# Patient Record
Sex: Female | Born: 1960
Health system: Southern US, Community
[De-identification: ages and names within clinical notes are randomized; demographics above are authoritative.]

## PROBLEM LIST (undated history)

## (undated) DIAGNOSIS — F329 Major depressive disorder, single episode, unspecified: Secondary | ICD-10-CM

## (undated) DIAGNOSIS — F32A Depression, unspecified: Secondary | ICD-10-CM

## (undated) DIAGNOSIS — E079 Disorder of thyroid, unspecified: Secondary | ICD-10-CM

## (undated) DIAGNOSIS — H9319 Tinnitus, unspecified ear: Secondary | ICD-10-CM

## (undated) HISTORY — PX: TONSILLECTOMY: SUR1361

## (undated) HISTORY — PX: ABDOMINAL HYSTERECTOMY: SHX81

---

## 2004-11-26 ENCOUNTER — Ambulatory Visit (HOSPITAL_COMMUNITY): Admission: RE | Admit: 2004-11-26 | Discharge: 2004-11-26 | Payer: Self-pay | Admitting: Orthopedic Surgery

## 2010-05-24 ENCOUNTER — Encounter: Payer: Self-pay | Admitting: Orthopedic Surgery

## 2016-04-28 ENCOUNTER — Encounter (HOSPITAL_BASED_OUTPATIENT_CLINIC_OR_DEPARTMENT_OTHER): Payer: Self-pay

## 2016-04-28 ENCOUNTER — Emergency Department (HOSPITAL_BASED_OUTPATIENT_CLINIC_OR_DEPARTMENT_OTHER)
Admission: EM | Admit: 2016-04-28 | Discharge: 2016-04-28 | Disposition: A | Discharge status: Home / Self Care | Payer: BLUE CROSS/BLUE SHIELD | Attending: Emergency Medicine | Admitting: Emergency Medicine

## 2016-04-28 ENCOUNTER — Emergency Department (HOSPITAL_BASED_OUTPATIENT_CLINIC_OR_DEPARTMENT_OTHER): Payer: BLUE CROSS/BLUE SHIELD

## 2016-04-28 DIAGNOSIS — Z79899 Other long term (current) drug therapy: Secondary | ICD-10-CM | POA: Diagnosis not present

## 2016-04-28 DIAGNOSIS — H8110 Benign paroxysmal vertigo, unspecified ear: Secondary | ICD-10-CM | POA: Diagnosis not present

## 2016-04-28 DIAGNOSIS — R42 Dizziness and giddiness: Secondary | ICD-10-CM | POA: Diagnosis present

## 2016-04-28 HISTORY — DX: Depression, unspecified: F32.A

## 2016-04-28 HISTORY — DX: Tinnitus, unspecified ear: H93.19

## 2016-04-28 HISTORY — DX: Major depressive disorder, single episode, unspecified: F32.9

## 2016-04-28 HISTORY — DX: Disorder of thyroid, unspecified: E07.9

## 2016-04-28 LAB — BASIC METABOLIC PANEL
Anion gap: 8 (ref 5–15)
BUN: 16 mg/dL (ref 6–20)
CHLORIDE: 103 mmol/L (ref 101–111)
CO2: 25 mmol/L (ref 22–32)
CREATININE: 0.6 mg/dL (ref 0.44–1.00)
Calcium: 9 mg/dL (ref 8.9–10.3)
GFR calc Af Amer: >60 mL/min (ref >60)
GFR calc non Af Amer: >60 mL/min (ref >60)
GLUCOSE: 99 mg/dL (ref 65–99)
Potassium: 3.6 mmol/L (ref 3.5–5.1)
SODIUM: 136 mmol/L (ref 135–145)

## 2016-04-28 LAB — CBC
HCT: 40.7 % (ref 36.0–46.0)
HEMOGLOBIN: 12.9 g/dL (ref 12.0–15.0)
MCH: 28.2 pg (ref 26.0–34.0)
MCHC: 31.7 g/dL (ref 30.0–36.0)
MCV: 89.1 fL (ref 78.0–100.0)
PLATELETS: 356 10*3/uL (ref 150–400)
RBC: 4.57 MIL/uL (ref 3.87–5.11)
RDW: 14.2 % (ref 11.5–15.5)
WBC: 8.1 10*3/uL (ref 4.0–10.5)

## 2016-04-28 MED ORDER — MECLIZINE HCL 25 MG PO TABS
25.0000 mg | ORAL_TABLET | Freq: Once | ORAL | Status: AC
  Administered 2016-04-28: 25 mg via ORAL
  Filled 2016-04-28: qty 1

## 2016-04-28 MED ORDER — SODIUM CHLORIDE 0.9 % IV BOLUS (SEPSIS)
1000.0000 mL | Freq: Once | INTRAVENOUS | Status: AC
  Administered 2016-04-28: 1000 mL via INTRAVENOUS

## 2016-04-28 MED ORDER — ONDANSETRON 4 MG PO TBDP: 4.0000 mg | ORAL_TABLET | Freq: Three times a day (TID) | ORAL | 0 refills | Status: AC | PRN

## 2016-04-28 MED ORDER — MECLIZINE HCL 25 MG PO TABS: 25.0000 mg | ORAL_TABLET | Freq: Three times a day (TID) | ORAL | 0 refills | Status: AC | PRN

## 2016-04-28 MED FILL — ONDANSETRON ODT 4 MG TABLET: 4 | 7 days supply | Qty: 20 | Fill #0

## 2016-04-28 MED FILL — MECLIZINE 25 MG TABLET: 25 | 10 days supply | Qty: 30 | Fill #0

## 2016-04-28 NOTE — ED Triage Notes (Signed)
C/o dizziness, n/v, pain to head and neck x 4 days-unsure if pain is r/t to holding head in same position to ease dizziness-denies injury-NAD-steady gait

## 2016-04-28 NOTE — ED Notes (Signed)
Patient transported to CT 

## 2016-04-28 NOTE — ED Notes (Signed)
Patient given sprite for PO challenge.  

## 2016-04-28 NOTE — ED Provider Notes (Signed)
[facility]   CSN: [id number] Arrival date & time: 04/28/16  1209     History   Chief Complaint Chief Complaint  Patient presents with  . Dizziness    HPI Donna Melton is a 55 y.o. female.  HPI  Pt presenting with c/o dizziness- she states that over the past week she has been very dizzy with room spinning when she turns her head to the side or bends over.  This has caused associated vomiting.  She also c/o pain in right side of neck that has developed due to sleeping in recliner trying to hold head very straight.  She also states she has had tinnitus in her ears for over one year.  She has not been evaluated for this in the past.  No fever.  No changes in vision or speech.  No focal weakness.  There are no other associated systemic symptoms, there are no other alleviating or modifying factors.   Past Medical History:  Diagnosis Date  . Depression   . Thyroid disease   . Tinnitus     There are no active problems to display for this patient.   Past Surgical History:  Procedure Laterality Date  . ABDOMINAL HYSTERECTOMY    . TONSILLECTOMY      OB History    No data available       Home Medications    Prior to Admission medications   Medication Sig Start Date End Date Taking? Authorizing Provider  BuPROPion HCl (WELLBUTRIN PO) Take by mouth.   Yes Historical Provider, MD  Levothyroxine Sodium (SYNTHROID PO) Take by mouth.   Yes Historical Provider, MD  meclizine (ANTIVERT) 25 MG tablet Take 1 tablet (25 mg total) by mouth 3 (three) times daily as needed for dizziness. 04/28/16   Jerelyn ScottMartha Linker, MD  ondansetron (ZOFRAN ODT) 4 MG disintegrating tablet Take 1 tablet (4 mg total) by mouth every 8 (eight) hours as needed for nausea or vomiting. 04/28/16   Jerelyn ScottMartha Linker, MD    Family History No family history on file.  Social History Social History  Substance Use Topics  . Smoking status: Never Smoker  . Smokeless tobacco: Never Used  .  Alcohol use No     Allergies   Patient has no known allergies.   Review of Systems Review of Systems  ROS reviewed and all otherwise negative except for mentioned in HPI   Physical Exam Updated Vital Signs BP 139/93 (BP Location: Right Arm)   Pulse 79   Temp 98.1 F (36.7 C) (Oral)   Resp 18   Ht 5\' 4"  (1.626 m)   Wt (!) 361 lb (163.7 kg)   SpO2 96%   BMI 61.97 kg/m  Vitals reviewed Physical Exam Physical Examination: General appearance - alert, well appearing, and in no distress Mental status - alert, oriented to person, place, and time Eyes - pupils equal and reactive, extraocular eye movements intact, some nystagmus with eye movements and this brings on her symptoms Mouth - mucous membranes moist, pharynx normal without lesions Neck - supple, no significant adenopathy, ttp over right paraspinal cervical tenderness Chest - clear to auscultation, no wheezes, rales or rhonchi, symmetric air entry Heart - normal rate, regular rhythm, normal S1, S2, no murmurs, rubs, clicks or gallops Abdomen - soft, nontender, nondistended, no masses or organomegaly Neurological - alert, oriented x 3, no cranial nerve defect, strength 5/5 in extremities x 4, sensation intact, nystagmus occurs and symptoms are reproduced with movement of head.  Extremities - peripheral pulses normal, no pedal edema, no clubbing or cyanosis Skin - normal coloration and turgor, no rashes  ED Treatments / Results  Labs (all labs ordered are listed, but only abnormal results are displayed) Labs Reviewed  CBC  BASIC METABOLIC PANEL    EKG  EKG Interpretation  Date/Time:  Wednesday April 28 2016 12:33:38 EST Ventricular Rate:  73 PR Interval:  136 QRS Duration: 96 QT Interval:  398 QTC Calculation: 438 R Axis:   4 Text Interpretation:  Normal sinus rhythm Cannot rule out Anterior infarct , age undetermined Abnormal ECG No old tracing to compare Confirmed by [facility]  MD, Sybel Standish [phone]([id number]) on 04/28/2016  12:48:51 PM       Radiology Ct Head Wo Contrast  Result Date: 04/28/2016 CLINICAL DATA:  Vertigo. EXAM: CT HEAD WITHOUT CONTRAST TECHNIQUE: Contiguous axial images were obtained from the base of the skull through the vertex without intravenous contrast. COMPARISON:  None. FINDINGS: Brain: No evidence of acute infarction, hemorrhage, hydrocephalus, extra-axial collection or mass lesion/mass effect. Vascular: Atherosclerosis of carotid siphons is noted. Skull: Normal. Negative for fracture or focal lesion. Sinuses/Orbits: No acute finding. Other: None. IMPRESSION: No acute intracranial abnormality seen. Electronically Signed   By: Lupita RaiderJames  Green Jr, M.D.   On: 04/28/2016 15:15    Procedures Procedures (including critical care time)  Medications Ordered in ED Medications  meclizine (ANTIVERT) tablet 25 mg (25 mg Oral Given 04/28/16 1459)  sodium chloride 0.9 % bolus 1,000 mL (0 mLs Intravenous Stopped 04/28/16 1621)     Initial Impression / Assessment and Plan / ED Course  I have reviewed the triage vital signs and the nursing notes.  Pertinent labs & imaging results that were available during my care of the patient were reviewed by me and considered in my medical decision making (see chart for details).  Clinical Course     Pt presenting with c/o vertigo- this is very related to position in this patient, reproducible on exam with turning head side to side.  Otherwise nonfocal neurologic exam.  Due to hx of tinnitus- head CT obtained to screen for acoustic neuroma or other tumor- this was reassuring.  Pt feeling improved after meclizine and zofran. She has received a fluid bolus and is able to tolerate po fluids in the ED.  Advised v/u with PMD and neurology. Discharged with strict return precautions.  Pt agreeable with plan.  Final Clinical Impressions(s) / ED Diagnoses   Final diagnoses:  Benign paroxysmal positional vertigo, unspecified laterality    New Prescriptions New  Prescriptions   MECLIZINE (ANTIVERT) 25 MG TABLET    Take 1 tablet (25 mg total) by mouth 3 (three) times daily as needed for dizziness.   ONDANSETRON (ZOFRAN ODT) 4 MG DISINTEGRATING TABLET    Take 1 tablet (4 mg total) by mouth every 8 (eight) hours as needed for nausea or vomiting.     Jerelyn ScottMartha Linker, MD 04/28/16 [id number]

## 2016-04-28 NOTE — Discharge Instructions (Signed)
Return to the ED with any concerns including changes in vision or speech, weakness of arms or legs, fainting, vomiting and not able to keep down liquids, decreased level of alertness/lethargy, or any other alarming symptoms °

## 2016-04-28 NOTE — ED Notes (Signed)
Patient drinking slowly on sprite without difficulties. Patient reports she still feels dizzy when she doesn't look straight ahead.

## 2017-11-03 IMAGING — CT CT HEAD W/O CM
3 series · 15 of 47 positions shown, 18 images · non-contrast
Comparison: None.

CLINICAL DATA: Vertigo.

EXAM:
CT HEAD WITHOUT CONTRAST
TECHNIQUE: Contiguous axial images were obtained from the base of the skull
through the vertex without intravenous contrast.

[Series 2: head wo · axial · 0.42mm/px · z∈[-172,-37]mm · 9 of 33 slices shown, 12 images]
[im 3/33  brain]
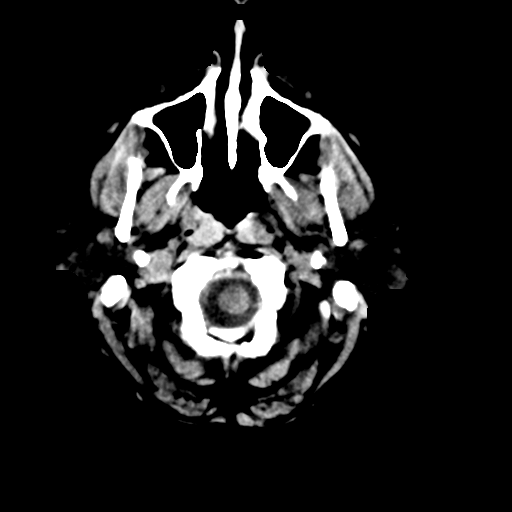
[im 3/33  bone]
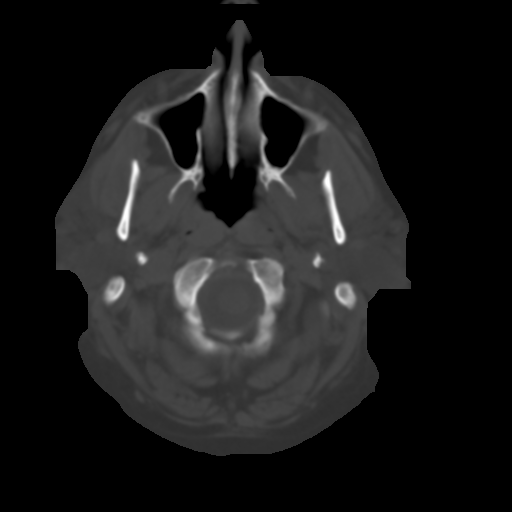
[im 6/33  brain]
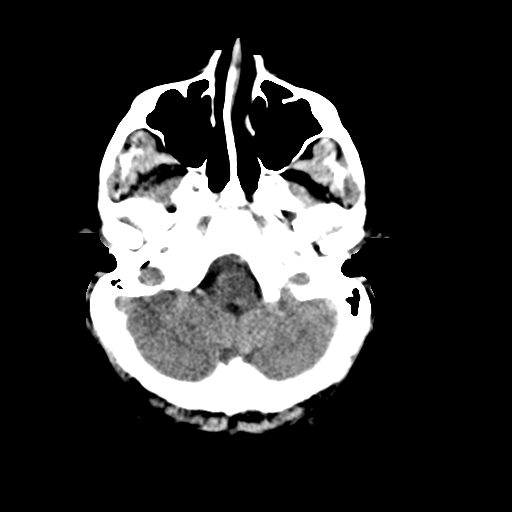
[im 9/33  brain]
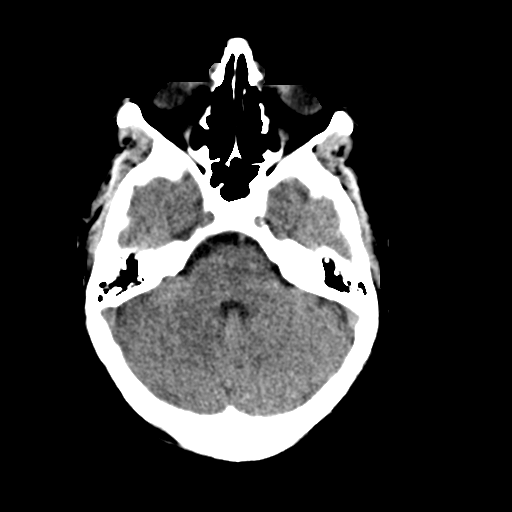
[im 13/33  brain]
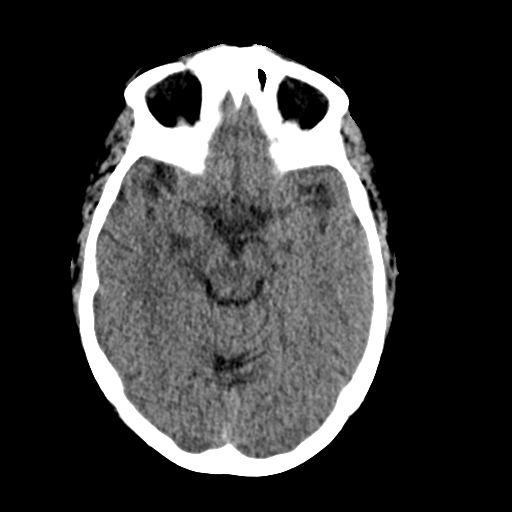
[im 17/33  brain]
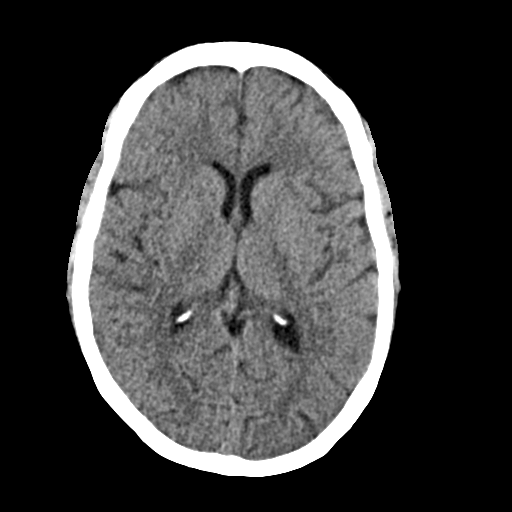
[im 17/33  bone]
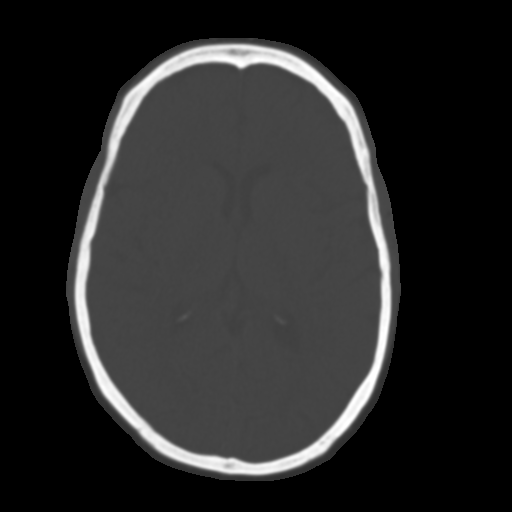
[im 20/33  brain]
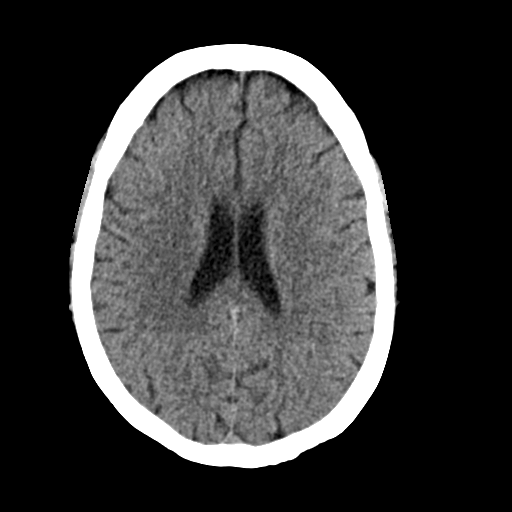
[im 24/33  brain]
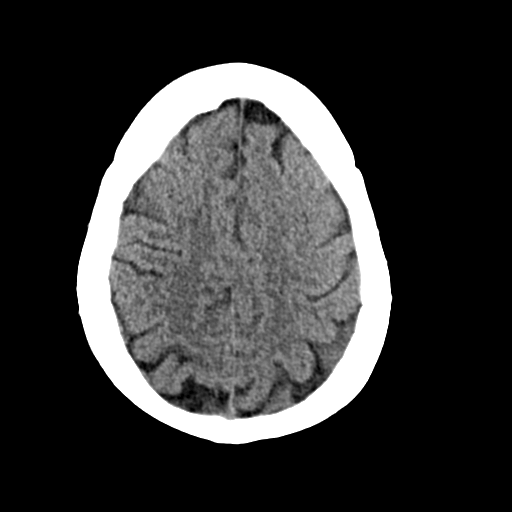
[im 27/33  brain]
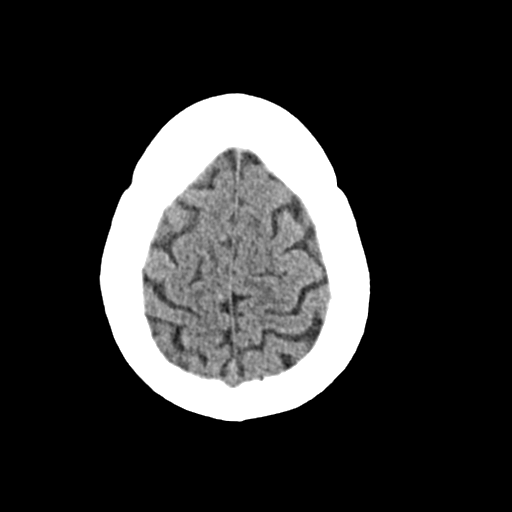
[im 30/33  brain]
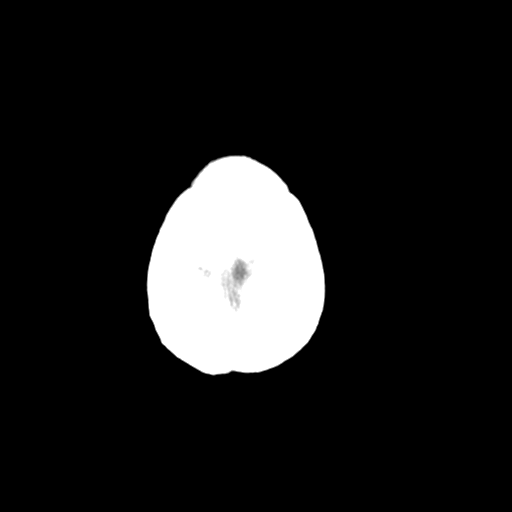
[im 30/33  bone]
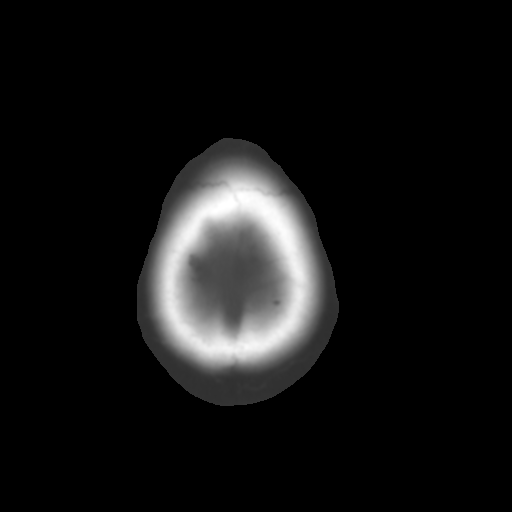

[Series 4: coronal soft · coronal · 0.32mm/px · 3 of 67 slices shown]
[im 23/67  brain]
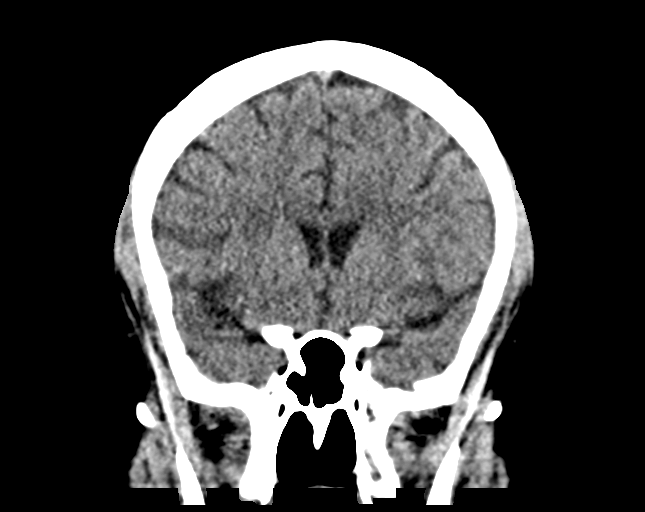
[im 30/67  brain]
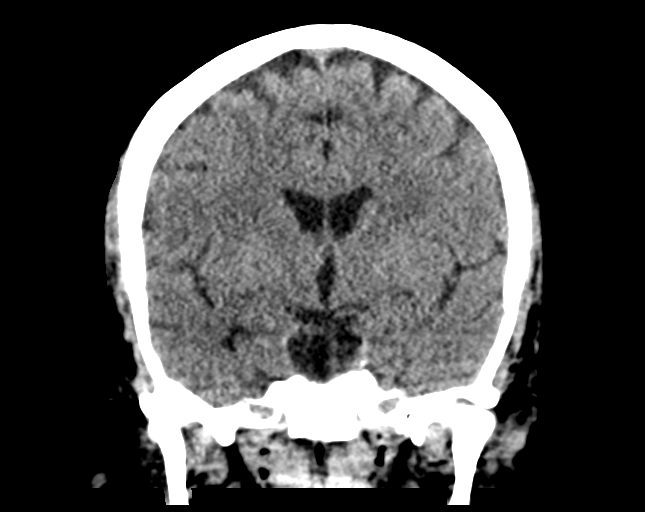
[im 37/67  brain]
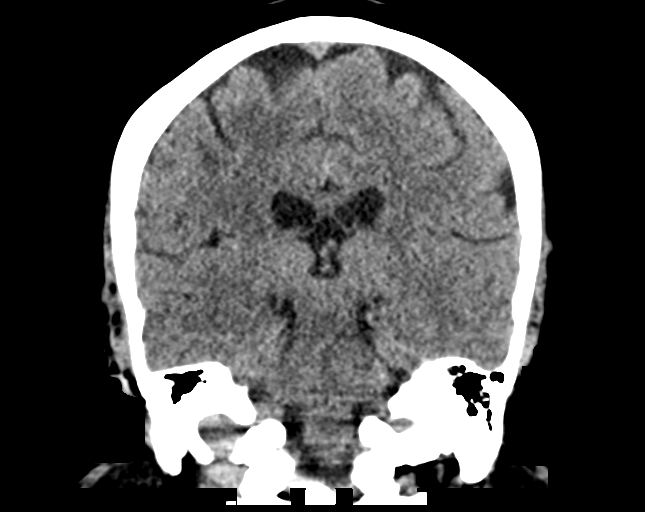

[Series 5: sag soft · sagittal · 0.32mm/px · 3 of 54 slices shown]
[im 18/54  brain]
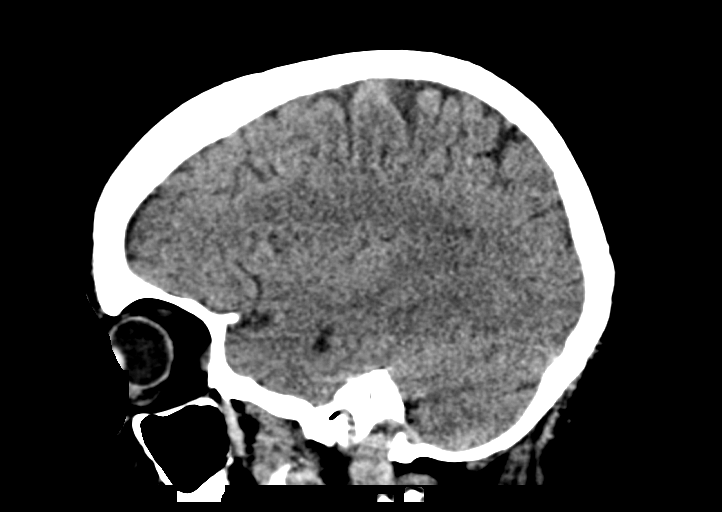
[im 27/54  brain]
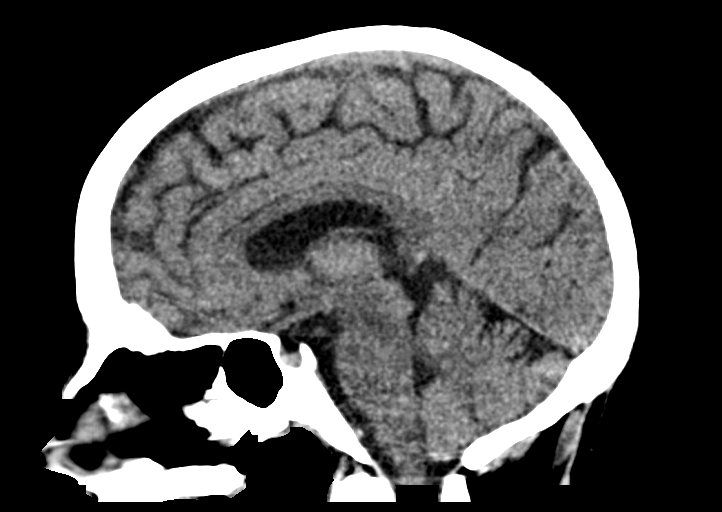
[im 36/54  brain]
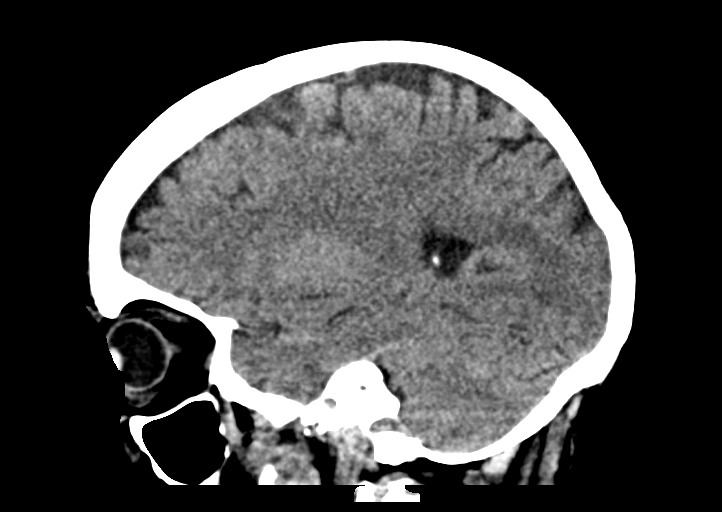

[15 of 47 positions shown; findings below may reference images not displayed]

FINDINGS: Brain: No evidence of acute infarction, hemorrhage, hydrocephalus,
extra-axial collection or mass lesion/mass effect.

Vascular: Atherosclerosis of carotid siphons is noted.

Skull: Normal. Negative for fracture or focal lesion.

Sinuses/Orbits: No acute finding.

Other: None.
IMPRESSION: No acute intracranial abnormality seen.
